# Patient Record
Sex: Male | Born: 1955 | Race: White | Hispanic: No | Smoking: Never smoker
Health system: Southern US, Community
[De-identification: ages and names within clinical notes are randomized; demographics above are authoritative.]

## PROBLEM LIST (undated history)

## (undated) DIAGNOSIS — C801 Malignant (primary) neoplasm, unspecified: Secondary | ICD-10-CM

## (undated) DIAGNOSIS — B029 Zoster without complications: Secondary | ICD-10-CM

## (undated) HISTORY — PX: OTHER SURGICAL HISTORY: SHX169

---

## 2012-11-21 HISTORY — PX: HERNIA REPAIR: SHX51

## 2012-11-21 HISTORY — PX: OTHER SURGICAL HISTORY: SHX169

## 2015-11-22 DIAGNOSIS — C801 Malignant (primary) neoplasm, unspecified: Secondary | ICD-10-CM

## 2015-11-22 HISTORY — DX: Malignant (primary) neoplasm, unspecified: C80.1

## 2016-03-21 HISTORY — PX: BIOPSY PROSTATE: PRO28

## 2016-08-01 ENCOUNTER — Other Ambulatory Visit: Payer: Self-pay | Admitting: Urology

## 2016-08-29 ENCOUNTER — Other Ambulatory Visit (HOSPITAL_COMMUNITY): Payer: Self-pay | Admitting: *Deleted

## 2016-08-29 NOTE — Patient Instructions (Addendum)
SPURGEON EHLY  08/29/2016   Your procedure is scheduled on: 09-01-16  Report to [facility]  Entrance take [facility]  elevators to 3rd floor to  [facility] at 930 AM.  Call this number if you have problems the morning of surgery [phone]   Remember: ONLY 1 PERSON MAY GO WITH YOU TO SHORT STAY TO GET  READY MORNING OF [place].  Do not eat food  :After [place] PREP INSTRUCTIONS FROM DR Alinda Money. CLEAR LIQUIDS ALL DAY Wednesday 08-31-16 PER DR BORDEN INSTRUCTIONS.NO CLEAR LIQUIDS AFTER MIDNIGHT WEDNESDAYNIGHT.      Take these medicines the morning of surgery with A SIP OF WATER: none              You may not have any metal on your body including hair pins and              piercings  Do not wear jewelry, make-up, lotions, powders or perfumes, deodorant             Do not wear nail polish.  Do not shave  48 hours prior to surgery.              Men may shave face and neck.   Do not bring valuables to the hospital. [place].  Contacts, dentures or bridgework may not be worn into surgery.  Leave suitcase in the car. After surgery it may be brought to your room.                  Please read over the following fact sheets you were given: _____________________________________________________________________                CLEAR LIQUID DIET   Foods Allowed                                                                     Foods Excluded  Coffee and tea, regular and decaf                             liquids that you cannot  Plain Jell-O in any flavor                                             see through such as: Fruit ices (not with fruit pulp)                                     milk, soups, orange juice  Iced Popsicles                                    All solid food Carbonated beverages, regular and diet  Cranberry, grape and  apple juices Sports drinks like Gatorade Lightly seasoned clear broth or consume(fat free) Sugar, honey syrup  Sample Menu Breakfast                                Lunch                                     Supper Cranberry juice                    Beef broth                            Chicken broth Jell-O                                     Grape juice                           Apple juice Coffee or tea                        Jell-O                                      Popsicle                                                Coffee or tea                        Coffee or tea  _____________________________________________________________________  [facility] - Preparing for Surgery Before surgery, you can play an important role.  Because skin is not sterile, your skin needs to be as free of germs as possible.  You can reduce the number of germs on your skin by washing with CHG (chlorahexidine gluconate) soap before surgery.  CHG is an antiseptic cleaner which kills germs and bonds with the skin to continue killing germs even after washing. Please DO NOT use if you have an allergy to CHG or antibacterial soaps.  If your skin becomes reddened/irritated stop using the CHG and inform your nurse when you arrive at Short Stay. Do not shave (including legs and underarms) for at least 48 hours prior to the first CHG shower.  You may shave your face/neck. Please follow these instructions carefully:  1.  Shower with CHG Soap the night before surgery and the  morning of Surgery.  2.  If you choose to wash your hair, wash your hair first as usual with your  normal  shampoo.  3.  After you shampoo, rinse your hair and body thoroughly to remove the  shampoo.                           4.  Use CHG as you would any other liquid soap.  You can apply chg directly  to the skin and wash  Gently with a scrungie or clean washcloth.  5.  Apply the CHG Soap to your body ONLY FROM THE NECK DOWN.   Do  not use on face/ open                           Wound or open sores. Avoid contact with eyes, ears mouth and genitals (private parts).                       Wash face,  Genitals (private parts) with your normal soap.             6.  Wash thoroughly, paying special attention to the area where your surgery  will be performed.  7.  Thoroughly rinse your body with warm water from the neck down.  8.  DO NOT shower/wash with your normal soap after using and rinsing off  the CHG Soap.                9.  Pat yourself dry with a clean towel.            10.  Wear clean pajamas.            11.  Place clean sheets on your bed the night of your first shower and do not  sleep with pets. Day of Surgery : Do not apply any lotions/deodorants the morning of surgery.  Please wear clean clothes to the hospital/surgery center.  FAILURE TO FOLLOW THESE INSTRUCTIONS MAY RESULT IN THE CANCELLATION OF YOUR SURGERY PATIENT SIGNATURE_________________________________  NURSE SIGNATURE__________________________________  ________________________________________________________________________   Adam Phenix  An incentive spirometer is a tool that can help keep your lungs clear and active. This tool measures how well you are filling your lungs with each breath. Taking long deep breaths may help reverse or decrease the chance of developing breathing (pulmonary) problems (especially infection) following:  A long period of time when you are unable to move or be active. BEFORE THE PROCEDURE   If the spirometer includes an indicator to show your best effort, your nurse or respiratory therapist will set it to a desired goal.  If possible, sit up straight or lean slightly forward. Try not to slouch.  Hold the incentive spirometer in an upright position. INSTRUCTIONS FOR USE  1. Sit on the edge of your bed if possible, or sit up as far as you can in bed or on a chair. 2. Hold the incentive spirometer in an upright  position. 3. Breathe out normally. 4. Place the mouthpiece in your mouth and seal your lips tightly around it. 5. Breathe in slowly and as deeply as possible, raising the piston or the ball toward the top of the column. 6. Hold your breath for 3-5 seconds or for as long as possible. Allow the piston or ball to fall to the bottom of the column. 7. Remove the mouthpiece from your mouth and breathe out normally. 8. Rest for a few seconds and repeat Steps 1 through 7 at least 10 times every 1-2 hours when you are awake. Take your time and take a few normal breaths between deep breaths. 9. The spirometer may include an indicator to show your best effort. Use the indicator as a goal to work toward during each repetition. 10. After each set of 10 deep breaths, practice coughing to be sure your lungs are clear. If you have an incision (the cut made at the time of  surgery), support your incision when coughing by placing a pillow or rolled up towels firmly against it. Once you are able to get out of bed, walk around indoors and cough well. You may stop using the incentive spirometer when instructed by your caregiver.  RISKS AND COMPLICATIONS  Take your time so you do not get dizzy or light-headed.  If you are in pain, you may need to take or ask for pain medication before doing incentive spirometry. It is harder to take a deep breath if you are having pain. AFTER USE  Rest and breathe slowly and easily.  It can be helpful to keep track of a log of your progress. Your caregiver can provide you with a simple table to help with this. If you are using the spirometer at home, follow these instructions: [place] IF:   You are having difficultly using the spirometer.  You have trouble using the spirometer as often as instructed.  Your pain medication is not giving enough relief while using the spirometer.  You develop fever of 100.5 F (38.1 C) or higher. SEEK IMMEDIATE MEDICAL CARE IF:    You cough up bloody sputum that had not been present before.  You develop fever of 102 F (38.9 C) or greater.  You develop worsening pain at or near the incision site. MAKE SURE YOU:   Understand these instructions.  Will watch your condition.  Will get help right away if you are not doing well or get worse. Document Released: 03/20/2007 Document Revised: 01/30/2012 Document Reviewed: 05/21/2007 Exit[employer] Patient Information 2014 Exit[employer], [place].   ________________________________________________________________________  WHAT IS A BLOOD TRANSFUSION? Blood Transfusion Information  A transfusion is the replacement of blood or some of its parts. Blood is made up of multiple cells which provide different functions.  Red blood cells carry oxygen and are used for blood loss replacement.  White blood cells fight against infection.  Platelets control bleeding.  Plasma helps clot blood.  Other blood products are available for specialized needs, such as hemophilia or other clotting disorders. BEFORE THE TRANSFUSION  Who gives blood for transfusions?   Healthy volunteers who are fully evaluated to make sure their blood is safe. This is blood bank blood. Transfusion therapy is the safest it has ever been in the practice of medicine. Before blood is taken from a donor, a complete history is taken to make sure that person has no history of diseases nor engages in risky social behavior (examples are intravenous drug use or sexual activity with multiple partners). The donor's travel history is screened to minimize risk of transmitting infections, such as malaria. The donated blood is tested for signs of infectious diseases, such as HIV and hepatitis. The blood is then tested to be sure it is compatible with you in order to minimize the chance of a transfusion reaction. If you or a relative donates blood, this is often done in anticipation of surgery and is not appropriate for emergency  situations. It takes many days to process the donated blood. RISKS AND COMPLICATIONS Although transfusion therapy is very safe and saves many lives, the main dangers of transfusion include:   Getting an infectious disease.  Developing a transfusion reaction. This is an allergic reaction to something in the blood you were given. Every precaution is taken to prevent this. The decision to have a blood transfusion has been considered carefully by your caregiver before blood is given. Blood is not given unless the benefits outweigh the risks. AFTER THE  TRANSFUSION  Right after receiving a blood transfusion, you will usually feel much better and more energetic. This is especially true if your red blood cells have gotten low (anemic). The transfusion raises the level of the red blood cells which carry oxygen, and this usually causes an energy increase.  The nurse administering the transfusion will monitor you carefully for complications. HOME CARE INSTRUCTIONS  No special instructions are needed after a transfusion. You may find your energy is better. Speak with your caregiver about any limitations on activity for underlying diseases you may have. SEEK MEDICAL CARE IF:   Your condition is not improving after your transfusion.  You develop redness or irritation at the intravenous (IV) site. SEEK IMMEDIATE MEDICAL CARE IF:  Any of the following symptoms occur over the next 12 hours:  Shaking chills.  You have a temperature by mouth above 102 F (38.9 C), not controlled by medicine.  Chest, back, or muscle pain.  People around you feel you are not acting correctly or are confused.  Shortness of breath or difficulty breathing.  Dizziness and fainting.  You get a rash or develop hives.  You have a decrease in urine output.  Your urine turns a dark color or changes to pink, red, or brown. Any of the following symptoms occur over the next 10 days:  You have a temperature by mouth above  102 F (38.9 C), not controlled by medicine.  Shortness of breath.  Weakness after normal activity.  The white part of the eye turns yellow (jaundice).  You have a decrease in the amount of urine or are urinating less often.  Your urine turns a dark color or changes to pink, red, or brown. Document Released: 11/04/2000 Document Revised: 01/30/2012 Document Reviewed: 06/23/2008 [facility] Patient Information 2014 [place], [place].  _______________________________________________________________________

## 2016-08-30 ENCOUNTER — Ambulatory Visit (HOSPITAL_COMMUNITY)
Admission: RE | Admit: 2016-08-30 | Discharge: 2016-08-30 | Disposition: A | Discharge status: Home / Self Care | Payer: Self-pay | Source: Ambulatory Visit | Attending: Urology | Admitting: Urology

## 2016-08-30 ENCOUNTER — Encounter (HOSPITAL_COMMUNITY)
Admission: RE | Admit: 2016-08-30 | Discharge: 2016-08-30 | Disposition: A | Discharge status: Home / Self Care | Payer: Self-pay | Source: Ambulatory Visit | Attending: Urology | Admitting: Urology

## 2016-08-30 ENCOUNTER — Encounter (HOSPITAL_COMMUNITY): Payer: Self-pay

## 2016-08-30 DIAGNOSIS — Z01818 Encounter for other preprocedural examination: Secondary | ICD-10-CM

## 2016-08-30 HISTORY — DX: Malignant (primary) neoplasm, unspecified: C80.1

## 2016-08-30 HISTORY — DX: Zoster without complications: B02.9

## 2016-08-30 LAB — BASIC METABOLIC PANEL
Anion gap: 9 (ref 5–15)
BUN: 16 mg/dL (ref 6–20)
CHLORIDE: 103 mmol/L (ref 101–111)
CO2: 27 mmol/L (ref 22–32)
CREATININE: 0.91 mg/dL (ref 0.61–1.24)
Calcium: 9.3 mg/dL (ref 8.9–10.3)
GFR calc Af Amer: >60 mL/min (ref >60)
GFR calc non Af Amer: >60 mL/min (ref >60)
Glucose, Bld: 92 mg/dL (ref 65–99)
POTASSIUM: 3.9 mmol/L (ref 3.5–5.1)
SODIUM: 139 mmol/L (ref 135–145)

## 2016-08-30 LAB — CBC
HEMATOCRIT: 43.4 % (ref 39.0–52.0)
Hemoglobin: 14.3 g/dL (ref 13.0–17.0)
MCH: 30.8 pg (ref 26.0–34.0)
MCHC: 32.9 g/dL (ref 30.0–36.0)
MCV: 93.3 fL (ref 78.0–100.0)
PLATELETS: 198 10*3/uL (ref 150–400)
RBC: 4.65 MIL/uL (ref 4.22–5.81)
RDW: 13 % (ref 11.5–15.5)
WBC: 6.1 10*3/uL (ref 4.0–10.5)

## 2016-08-30 LAB — ABO/RH: ABO/RH(D): A NEG

## 2016-08-31 NOTE — H&P (Signed)
Office Visit Report     08/16/2016   --------------------------------------------------------------------------------   Rickey Davidson  [id number]  PRIMARY CARE:    DOB: 1956-06-14, 60 year old Male  REFERRING:  Brad L. Exie Parody, MD  SSN: -[id number]  PROVIDER:  Raynelle Bring, M.D.    LOCATION:  [facility] [phone]   --------------------------------------------------------------------------------   CC/HPI: CC: Prostate Cancer   PCP: Dr. Stoney Bang   Rickey Davidson is a 60 year old gentleman that I initially saw in consultation in June 2017 for evaluation of prostate cancer. He was found to have an elevated PSA of 10.1 prompting a TRUS biopsy of the prostate by Dr. Clyde Lundborg in [place], [place] on 02/25/16. He was found to have 6 out of 16 biopsy cores positive for Gleason 3+3=6 adenocarcinoma of the prostate. He was most interested in proceeding with surgery but was uninsured at the time of his last consultation and was attempting to qualify for Medicaid. He ultimately was found to be not eligible for Medicaid. Initially, he delayed any treatment and ultimately has decided to proceed with surgery without insurance. He denies any newmedical problems over the past few months.   Family history: Father.   Imaging studies: None.   PMH: He has a history of GERD and asthma.  PSH: Left inguinal hernia, laparoscopic Nissen   TNM stage: cT1c Nx Mx  PSA: 10.1  Gleason score: 3+3=6  Biopsy (02/25/16 - read by Dr. Mark Martinique, [facility], [place] # [phone]): 6/16 cores positive  Left: L apex (2/2 cores, 10% and 10%, 3+3=6)  Right: R apex (2/2 cores, 60% and 100%, 3+3=6), R mid (2/3 cores, 40%,20%, 3+3=6)  Prostate volume: 41.1 cc  PSAD: 0.25   Urinary function: IPSS is 9.  Erectile function: SHIM score is 1 (not sexually active).     ALLERGIES: No Known Drug Allergies    MEDICATIONS: Acyclovir 200 mg capsule     GU PSH: Hernia Repair, Left - about 2014    NON-GU PSH:  Laparoscopy, Surgical, Repair Of Paraesophageal Hernia, Includes Fundoplasty, When Performed; Withou    GU PMH: Prostate Cancer - 04/26/2016    NON-GU PMH: Asthma GERD    FAMILY HISTORY: Breast Cancer - Mother Death of family member - Father, Mother Prostate Cancer - Father   SOCIAL HISTORY: Marital Status: Single Current Smoking Status: Patient has never smoked.  Drinks 1 drink per day. Types of alcohol consumed: Beer.  Drinks 3 caffeinated drinks per day.    REVIEW OF SYSTEMS:    GU Review Male:   Patient reports get up at night to urinate. Patient denies frequent urination, burning/ pain with urination, leakage of urine, hard to postpone urination, stream starts and stops, trouble starting your streams, and have to strain to urinate .  Gastrointestinal (Lower):   Patient denies diarrhea and constipation.  Gastrointestinal (Upper):   Patient denies nausea and vomiting.  Constitutional:   Patient denies fever, night sweats, weight loss, and fatigue.  Skin:   Patient denies skin rash/ lesion and itching.  Eyes:   Patient denies blurred vision and double vision.  Ears/ Nose/ Throat:   Patient denies sore throat and sinus problems.  Hematologic/Lymphatic:   Patient denies swollen glands and easy bruising.  Cardiovascular:   Patient denies leg swelling and chest pains.  Respiratory:   Patient denies cough and shortness of breath.  Endocrine:   Patient denies excessive thirst.  Musculoskeletal:   Patient reports joint pain. Patient denies back pain.  Neurological:   Patient denies headaches and dizziness.  Psychologic:   Patient denies depression and anxiety.   VITAL SIGNS:      08/16/2016 08:14 AM  Weight 176 lb / 79.83 kg  Height 64 in / 162.56 cm  BP 132/74 mmHg  Pulse 71 /min  BMI 30.2 kg/m   GU PHYSICAL EXAMINATION:    Prostate: 35 cc. No nodularity or induration.    MULTI-SYSTEM PHYSICAL EXAMINATION:    Constitutional: Well-nourished. No physical deformities. Normally  developed. Good grooming.  Respiratory: No labored breathing, no use of accessory muscles.   Cardiovascular: Normal temperature, normal extremity pulses, no swelling, no varicosities.  Gastrointestinal: No mass, no tenderness, no rigidity, non obese abdomen.     PAST DATA REVIEWED:  Source Of History:  Patient  Lab Test Review:   PSA  Records Review:   Pathology Reports, Previous Patient Records  Urine Test Review:   Urinalysis   PROCEDURES:          Urinalysis - 81003 Dipstick Dipstick Cont'd  Specimen: Voided Bilirubin: Neg  Color: Yellow Ketones: Neg  Appearance: Clear Blood: Neg  Specific Gravity: 1.025 Protein: Neg  pH: 5.0 Urobilinogen: 0.2  Glucose: Neg Nitrites: Neg    Leukocyte Esterase: Neg    ASSESSMENT:      ICD-10 Details  1 GU:   Prostate Cancer - C61    PLAN:           Orders Labs PSA          Schedule Return Visit: Keep Scheduled Appointment          Document Letter(s):  Created for Patient: Clinical Summary         Notes:   1. Prostate cancer: Since his last visit, I have obtained his pathology report with findings as dictated above. This confirms Gleason 6, moderate volume disease. We reviewed this information and the fact that it confirms our prior discussion regarding his prostate cancer. He does still adamantly wanted to proceed with surgical therapy and is currently scheduled for a radical prostatectomy on October 12.   I've recommended that we repeat his PSA to fully assess his risk. He understands how this might affect her decision to perform a lymphadenectomy.   The patient was counseled about the natural history of prostate cancer and the standard treatment options that are available for prostate cancer. It was explained to him how his age and life expectancy, clinical stage, Gleason score, and PSA affect his prognosis, the decision to proceed with additional staging studies, as well as how that information influences recommended treatment  strategies. We discussed the roles for active surveillance, radiation therapy, surgical therapy, androgen deprivation, as well as ablative therapy options for the treatment of prostate cancer as appropriate to his individual cancer situation. We discussed the risks and benefits of these options with regard to their impact on cancer control and also in terms of potential adverse events, complications, and impact on quality of life particularly related to urinary and sexual function. The patient was encouraged to ask questions throughout the discussion today and all questions were answered to his stated satisfaction. In addition, the patient was provided with and/or directed to appropriate resources and literature for further education about prostate cancer and treatment options.   We discussed surgical therapy for prostate cancer including the different available surgical approaches. We discussed, in detail, the risks and expectations of surgery with regard to cancer control, urinary control, and erectile function as well as the expected  postoperative recovery process. Additional risks of surgery including but not limited to bleeding, infection, hernia formation, nerve damage, lymphocele formation, bowel/rectal injury potentially necessitating colostomy, damage to the urinary tract resulting in urine leakage, urethral stricture, and the cardiopulmonary risks such as myocardial infarction, stroke, death, venothromboembolism, etc. were explained. The risk of open surgical conversion for robotic/laparoscopic prostatectomy was also discussed.   We will proceed with a bilateral nerve sparing robot-assisted laparoscopic radical prostatectomy plus or minus pelvic lymphadenectomy pending his PSA result. All questions were answered to his stated satisfaction.   CC: Dr. Clyde Lundborg  Dr. Stoney Bang          E & M CODE: I spent at least 40 minutes face to face with the patient, more than 50% of that time was  spent on counseling and/or coordinating care.     * Signed by Raynelle Bring, M.D. on 08/16/16 at 4:27 PM (EDT)*       APPENDED NOTES:  Repeat PSA was 6.82. Will proceed with a bilateral nerve sparing robot-assisted laparoscopic radical prostatectomy but will not perform a lymph node dissection considering his low risk disease.     * Signed by Raynelle Bring, M.D. on 08/17/16 at 9:40 AM (EDT)*

## 2016-09-01 ENCOUNTER — Inpatient Hospital Stay (HOSPITAL_COMMUNITY)
Admission: RE | Admit: 2016-09-01 | Discharge: 2016-09-02 | DRG: 708 | Disposition: A | Discharge status: Home / Self Care | Payer: Self-pay | Source: Ambulatory Visit | Attending: Urology | Admitting: Urology

## 2016-09-01 ENCOUNTER — Inpatient Hospital Stay (HOSPITAL_COMMUNITY): Payer: Self-pay | Admitting: Anesthesiology

## 2016-09-01 ENCOUNTER — Encounter (HOSPITAL_COMMUNITY)
Admission: RE | Disposition: A | Discharge status: Home / Self Care | Payer: Self-pay | Source: Ambulatory Visit | Attending: Urology

## 2016-09-01 ENCOUNTER — Encounter (HOSPITAL_COMMUNITY): Payer: Self-pay | Admitting: *Deleted

## 2016-09-01 DIAGNOSIS — Z803 Family history of malignant neoplasm of breast: Secondary | ICD-10-CM

## 2016-09-01 DIAGNOSIS — Z8042 Family history of malignant neoplasm of prostate: Secondary | ICD-10-CM

## 2016-09-01 DIAGNOSIS — B009 Herpesviral infection, unspecified: Secondary | ICD-10-CM | POA: Diagnosis not present

## 2016-09-01 DIAGNOSIS — K219 Gastro-esophageal reflux disease without esophagitis: Secondary | ICD-10-CM | POA: Diagnosis present

## 2016-09-01 DIAGNOSIS — J45909 Unspecified asthma, uncomplicated: Secondary | ICD-10-CM | POA: Diagnosis present

## 2016-09-01 DIAGNOSIS — C61 Malignant neoplasm of prostate: Principal | ICD-10-CM | POA: Diagnosis present

## 2016-09-01 HISTORY — PX: ROBOT ASSISTED LAPAROSCOPIC RADICAL PROSTATECTOMY: SHX5141

## 2016-09-01 LAB — TYPE AND SCREEN
ABO/RH(D): A NEG
Antibody Screen: NEGATIVE

## 2016-09-01 LAB — HEMOGLOBIN AND HEMATOCRIT, BLOOD
HEMATOCRIT: 39.7 % (ref 39.0–52.0)
HEMOGLOBIN: 13.6 g/dL (ref 13.0–17.0)

## 2016-09-01 SURGERY — XI ROBOTIC ASSISTED LAPAROSCOPIC RADICAL PROSTATECTOMY LEVEL 2
Anesthesia: General

## 2016-09-01 MED ORDER — HEPARIN SODIUM (PORCINE) 1000 UNIT/ML IJ SOLN
INTRAMUSCULAR | Status: DC | PRN
  Administered 2016-09-01: 11:00:00

## 2016-09-01 MED ORDER — KETOROLAC TROMETHAMINE 15 MG/ML IJ SOLN
15.0000 mg | Freq: Four times a day (QID) | INTRAMUSCULAR | Status: DC
  Administered 2016-09-01 – 2016-09-02 (×4): 15 mg via INTRAVENOUS
  Filled 2016-09-01 (×4): qty 1

## 2016-09-01 MED ORDER — DOCUSATE SODIUM 100 MG PO CAPS
100.0000 mg | ORAL_CAPSULE | Freq: Two times a day (BID) | ORAL | Status: DC
  Administered 2016-09-01 – 2016-09-02 (×2): 100 mg via ORAL
  Filled 2016-09-01 (×2): qty 1

## 2016-09-01 MED ORDER — ONDANSETRON HCL 4 MG/2ML IJ SOLN
INTRAMUSCULAR | Status: DC | PRN
Start: 2016-09-01 — End: 2016-09-01
  Administered 2016-09-01: 4 mg via INTRAVENOUS

## 2016-09-01 MED ORDER — MIDAZOLAM HCL 2 MG/2ML IJ SOLN
INTRAMUSCULAR | Status: AC
  Filled 2016-09-01: qty 2

## 2016-09-01 MED ORDER — ONDANSETRON HCL 4 MG/2ML IJ SOLN
INTRAMUSCULAR | Status: AC
Start: 2016-09-01 — End: 2016-09-01
  Filled 2016-09-01: qty 2

## 2016-09-01 MED ORDER — ACETAMINOPHEN 325 MG PO TABS: 650.0000 mg | ORAL_TABLET | ORAL | Status: DC | PRN

## 2016-09-01 MED ORDER — CEFAZOLIN IN D5W 1 GM/50ML IV SOLN
1.0000 g | Freq: Three times a day (TID) | INTRAVENOUS | Status: AC
  Administered 2016-09-01 – 2016-09-02 (×2): 1 g via INTRAVENOUS
  Filled 2016-09-01 (×2): qty 50

## 2016-09-01 MED ORDER — PROMETHAZINE HCL 25 MG/ML IJ SOLN: 6.2500 mg | INTRAMUSCULAR | Status: DC | PRN

## 2016-09-01 MED ORDER — HYDROMORPHONE HCL 1 MG/ML IJ SOLN
INTRAMUSCULAR | Status: AC
  Filled 2016-09-01: qty 1

## 2016-09-01 MED ORDER — SUGAMMADEX SODIUM 200 MG/2ML IV SOLN
INTRAVENOUS | Status: DC | PRN
  Administered 2016-09-01: 200 mg via INTRAVENOUS

## 2016-09-01 MED ORDER — BUPIVACAINE HCL (PF) 0.25 % IJ SOLN
INTRAMUSCULAR | Status: AC
  Filled 2016-09-01: qty 30

## 2016-09-01 MED ORDER — HYDROMORPHONE HCL 1 MG/ML IJ SOLN
0.2500 mg | INTRAMUSCULAR | Status: DC | PRN
  Administered 2016-09-01 (×4): 0.5 mg via INTRAVENOUS

## 2016-09-01 MED ORDER — ONDANSETRON HCL 4 MG/2ML IJ SOLN
4.0000 mg | Freq: Four times a day (QID) | INTRAMUSCULAR | Status: DC | PRN
  Filled 2016-09-01: qty 2

## 2016-09-01 MED ORDER — SULFAMETHOXAZOLE-TRIMETHOPRIM 800-160 MG PO TABS: 1.0000 | ORAL_TABLET | Freq: Two times a day (BID) | ORAL | 0 refills | Status: AC

## 2016-09-01 MED ORDER — DEXAMETHASONE SODIUM PHOSPHATE 10 MG/ML IJ SOLN
INTRAMUSCULAR | Status: DC | PRN
  Administered 2016-09-01: 10 mg via INTRAVENOUS

## 2016-09-01 MED ORDER — SODIUM CHLORIDE 0.9 % IR SOLN
Status: DC | PRN
  Administered 2016-09-01: 1 via INTRAVESICAL

## 2016-09-01 MED ORDER — ROCURONIUM BROMIDE 10 MG/ML (PF) SYRINGE
PREFILLED_SYRINGE | INTRAVENOUS | Status: DC | PRN
  Administered 2016-09-01: 50 mg via INTRAVENOUS
  Administered 2016-09-01: 20 mg via INTRAVENOUS

## 2016-09-01 MED ORDER — SODIUM CHLORIDE 0.9 % IV BOLUS (SEPSIS): 1000.0000 mL | Freq: Once | INTRAVENOUS | Status: DC

## 2016-09-01 MED ORDER — LIDOCAINE 2% (20 MG/ML) 5 ML SYRINGE
INTRAMUSCULAR | Status: AC
Start: 2016-09-01 — End: 2016-09-01
  Filled 2016-09-01: qty 5

## 2016-09-01 MED ORDER — DIPHENHYDRAMINE HCL 12.5 MG/5ML PO ELIX: 12.5000 mg | ORAL_SOLUTION | Freq: Four times a day (QID) | ORAL | Status: DC | PRN

## 2016-09-01 MED ORDER — MORPHINE SULFATE (PF) 2 MG/ML IV SOLN
2.0000 mg | INTRAVENOUS | Status: DC | PRN
Start: 2016-09-01 — End: 2016-09-02

## 2016-09-01 MED ORDER — LACTATED RINGERS IV SOLN
INTRAVENOUS | Status: DC
  Administered 2016-09-01: 1000 mL via INTRAVENOUS
  Administered 2016-09-01: 13:00:00 via INTRAVENOUS

## 2016-09-01 MED ORDER — SUCCINYLCHOLINE CHLORIDE 20 MG/ML IJ SOLN
INTRAMUSCULAR | Status: AC
  Filled 2016-09-01: qty 1

## 2016-09-01 MED ORDER — FENTANYL CITRATE (PF) 100 MCG/2ML IJ SOLN
INTRAMUSCULAR | Status: DC | PRN
  Administered 2016-09-01: 100 ug via INTRAVENOUS
  Administered 2016-09-01 (×3): 50 ug via INTRAVENOUS

## 2016-09-01 MED ORDER — ROCURONIUM BROMIDE 50 MG/5ML IV SOSY
PREFILLED_SYRINGE | INTRAVENOUS | Status: AC
  Filled 2016-09-01: qty 5

## 2016-09-01 MED ORDER — DIPHENHYDRAMINE HCL 50 MG/ML IJ SOLN: 12.5000 mg | Freq: Four times a day (QID) | INTRAMUSCULAR | Status: DC | PRN

## 2016-09-01 MED ORDER — BUPIVACAINE-EPINEPHRINE 0.25% -1:200000 IJ SOLN
INTRAMUSCULAR | Status: DC | PRN
  Administered 2016-09-01: 20 mL

## 2016-09-01 MED ORDER — PROPOFOL 10 MG/ML IV BOLUS
INTRAVENOUS | Status: AC
  Filled 2016-09-01: qty 20

## 2016-09-01 MED ORDER — MIDAZOLAM HCL 5 MG/5ML IJ SOLN
INTRAMUSCULAR | Status: DC | PRN
  Administered 2016-09-01: 2 mg via INTRAVENOUS

## 2016-09-01 MED ORDER — DEXAMETHASONE SODIUM PHOSPHATE 10 MG/ML IJ SOLN
INTRAMUSCULAR | Status: AC
  Filled 2016-09-01: qty 1

## 2016-09-01 MED ORDER — HYDROCODONE-ACETAMINOPHEN 5-325 MG PO TABS: 1.0000 | ORAL_TABLET | Freq: Four times a day (QID) | ORAL | 0 refills | Status: AC | PRN

## 2016-09-01 MED ORDER — STERILE WATER FOR IRRIGATION IR SOLN
Status: DC | PRN
  Administered 2016-09-01: 1000 mL

## 2016-09-01 MED ORDER — SUCCINYLCHOLINE CHLORIDE 200 MG/10ML IV SOSY
PREFILLED_SYRINGE | INTRAVENOUS | Status: DC | PRN
  Administered 2016-09-01: 100 mg via INTRAVENOUS

## 2016-09-01 MED ORDER — LIDOCAINE 2% (20 MG/ML) 5 ML SYRINGE
INTRAMUSCULAR | Status: DC | PRN
  Administered 2016-09-01: 100 mg via INTRAVENOUS

## 2016-09-01 MED ORDER — HEPARIN SODIUM (PORCINE) 1000 UNIT/ML IJ SOLN
INTRAMUSCULAR | Status: AC
  Filled 2016-09-01: qty 1

## 2016-09-01 MED ORDER — PROPOFOL 10 MG/ML IV BOLUS
INTRAVENOUS | Status: DC | PRN
  Administered 2016-09-01: 200 mg via INTRAVENOUS

## 2016-09-01 MED ORDER — CEFAZOLIN SODIUM-DEXTROSE 2-4 GM/100ML-% IV SOLN
INTRAVENOUS | Status: AC
  Filled 2016-09-01: qty 100

## 2016-09-01 MED ORDER — CEFAZOLIN SODIUM-DEXTROSE 2-4 GM/100ML-% IV SOLN
2.0000 g | INTRAVENOUS | Status: AC
  Administered 2016-09-01: 2 g via INTRAVENOUS

## 2016-09-01 MED ORDER — FENTANYL CITRATE (PF) 250 MCG/5ML IJ SOLN
INTRAMUSCULAR | Status: AC
  Filled 2016-09-01: qty 5

## 2016-09-01 MED ORDER — KCL IN DEXTROSE-NACL 20-5-0.45 MEQ/L-%-% IV SOLN
INTRAVENOUS | Status: DC
  Administered 2016-09-01 – 2016-09-02 (×3): via INTRAVENOUS
  Filled 2016-09-01 (×3): qty 1000

## 2016-09-01 SURGICAL SUPPLY — 54 items
APPLICATOR COTTON TIP 6IN STRL (MISCELLANEOUS) ×4 IMPLANT
CATH FOLEY 2WAY SLVR 18FR 30CC (CATHETERS) ×4 IMPLANT
CATH ROBINSON RED A/P 16FR (CATHETERS) ×4 IMPLANT
CATH ROBINSON RED A/P 8FR (CATHETERS) ×4 IMPLANT
CATH TIEMANN FOLEY 18FR 5CC (CATHETERS) ×4 IMPLANT
CHLORAPREP W/TINT 26ML (MISCELLANEOUS) ×4 IMPLANT
CLIP LIGATING HEM O LOK PURPLE (MISCELLANEOUS) ×8 IMPLANT
COVER SURGICAL LIGHT HANDLE (MISCELLANEOUS) ×4 IMPLANT
COVER TIP SHEARS 8 DVNC (MISCELLANEOUS) ×2 IMPLANT
COVER TIP SHEARS 8MM DA VINCI (MISCELLANEOUS) ×2
CUTTER ECHEON FLEX ENDO 45 340 (ENDOMECHANICALS) ×4 IMPLANT
DECANTER SPIKE VIAL GLASS SM (MISCELLANEOUS) ×4 IMPLANT
DERMABOND ADVANCED (GAUZE/BANDAGES/DRESSINGS)
DERMABOND ADVANCED .7 DNX12 (GAUZE/BANDAGES/DRESSINGS) IMPLANT
DRAPE ARM DVNC X/XI (DISPOSABLE) ×8 IMPLANT
DRAPE COLUMN DVNC XI (DISPOSABLE) ×2 IMPLANT
DRAPE DA VINCI XI ARM (DISPOSABLE) ×8
DRAPE DA VINCI XI COLUMN (DISPOSABLE) ×2
DRAPE SURG IRRIG POUCH 19X23 (DRAPES) ×4 IMPLANT
DRSG TEGADERM 4X4.75 (GAUZE/BANDAGES/DRESSINGS) ×4 IMPLANT
ELECT REM PT RETURN 9FT ADLT (ELECTROSURGICAL) ×4
ELECTRODE REM PT RTRN 9FT ADLT (ELECTROSURGICAL) ×2 IMPLANT
GLOVE BIO SURGEON STRL SZ 6.5 (GLOVE) ×3 IMPLANT
GLOVE BIO SURGEONS STRL SZ 6.5 (GLOVE) ×1
GLOVE BIOGEL M STRL SZ7.5 (GLOVE) ×8 IMPLANT
GOWN STRL REUS W/TWL LRG LVL3 (GOWN DISPOSABLE) ×12 IMPLANT
HOLDER FOLEY CATH W/STRAP (MISCELLANEOUS) ×4 IMPLANT
IRRIG SUCT STRYKERFLOW 2 WTIP (MISCELLANEOUS) ×4
IRRIGATION SUCT STRKRFLW 2 WTP (MISCELLANEOUS) ×2 IMPLANT
IV LACTATED RINGERS 1000ML (IV SOLUTION) ×4 IMPLANT
LIQUID BAND (GAUZE/BANDAGES/DRESSINGS) ×4 IMPLANT
NDL SAFETY ECLIPSE 18X1.5 (NEEDLE) ×2 IMPLANT
NEEDLE HYPO 18GX1.5 SHARP (NEEDLE) ×2
PACK ROBOT UROLOGY CUSTOM (CUSTOM PROCEDURE TRAY) ×4 IMPLANT
RELOAD GREEN ECHELON 45 (STAPLE) ×4 IMPLANT
SEAL CANN UNIV 5-8 DVNC XI (MISCELLANEOUS) ×8 IMPLANT
SEAL XI 5MM-8MM UNIVERSAL (MISCELLANEOUS) ×8
SOLUTION ELECTROLUBE (MISCELLANEOUS) ×4 IMPLANT
SUT ETHILON 3 0 PS 1 (SUTURE) ×4 IMPLANT
SUT MNCRL 3 0 RB1 (SUTURE) ×2 IMPLANT
SUT MNCRL 3 0 VIOLET RB1 (SUTURE) ×2 IMPLANT
SUT MNCRL AB 4-0 PS2 18 (SUTURE) ×8 IMPLANT
SUT MONOCRYL 3 0 RB1 (SUTURE) ×4
SUT VIC AB 0 CT1 27 (SUTURE) ×2
SUT VIC AB 0 CT1 27XBRD ANTBC (SUTURE) ×2 IMPLANT
SUT VIC AB 0 UR5 27 (SUTURE) ×4 IMPLANT
SUT VIC AB 2-0 SH 27 (SUTURE) ×2
SUT VIC AB 2-0 SH 27X BRD (SUTURE) ×2 IMPLANT
SUT VICRYL 0 UR6 27IN ABS (SUTURE) ×8 IMPLANT
SYR 27GX1/2 1ML LL SAFETY (SYRINGE) ×4 IMPLANT
TOWEL OR 17X26 10 PK STRL BLUE (TOWEL DISPOSABLE) ×4 IMPLANT
TOWEL OR NON WOVEN STRL DISP B (DISPOSABLE) ×4 IMPLANT
TUBING INSUFFLATION 10FT LAP (TUBING) IMPLANT
WATER STERILE IRR 1500ML POUR (IV SOLUTION) ×8 IMPLANT

## 2016-09-01 NOTE — Op Note (Signed)
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 Robotic assisted laparoscopic radical prostatectomy (bilateral nerve sparing)  Surgeon: Roxy Horseman, Brooke Bonito. M.D.  Assistant: Debbrah Alar, PA-C  An assistant was required for this surgical procedure.  The duties of the assistant included but were not limited to suctioning, passing suture, camera manipulation, retraction. This procedure would not be able to be performed without an Environmental consultant.  Anesthesia: General  Complications: None  EBL: 50 mL  IVF:  1300 mL crystalloid  Specimens: 1. Prostate and seminal vesicles  Disposition of specimens: Pathology  Drains: 1. 20 Fr coude catheter 2. # 19 Blake pelvic drain  Indication: Rickey Davidson is a 60 y.o. year old patient with clinically localized prostate cancer.  After a thorough review of the management options for treatment of prostate cancer, he elected to proceed with surgical therapy and the above procedure(s).  We have discussed the potential benefits and risks of the procedure, side effects of the proposed treatment, the likelihood of the patient achieving the goals of the procedure, and any potential problems that might occur during the procedure or recuperation. Informed consent has been obtained.  Description of procedure:  The patient was taken to the operating room and a general anesthetic was administered. He was given preoperative antibiotics, placed in the dorsal lithotomy position, and prepped and draped in the usual sterile fashion. Next a preoperative timeout was performed. A urethral catheter was placed into the bladder and a site was selected near the umbilicus for placement of the camera port. This was placed using a standard open Hassan technique which allowed entry into the peritoneal cavity  under direct vision and without difficulty. An 8 mm port was placed and a pneumoperitoneum established. The camera was then used to inspect the abdomen and there was no evidence of any intra-abdominal injuries or other abnormalities. The remaining abdominal ports were then placed. 8 mm robotic ports were placed in the right lower quadrant, left lower quadrant, and far left lateral abdominal wall. A 5 mm port was placed in the right upper quadrant and a 12 mm port was placed in the right lateral abdominal wall for laparoscopic assistance. All ports were placed under direct vision without difficulty. The surgical cart was then docked.   Utilizing the cautery scissors, the bladder was reflected posteriorly allowing entry into the space of Retzius and identification of the endopelvic fascia and prostate. The periprostatic fat was then removed from the prostate allowing full exposure of the endopelvic fascia. The endopelvic fascia was then incised from the apex back to the base of the prostate bilaterally and the underlying levator muscle fibers were swept laterally off the prostate thereby isolating the dorsal venous complex. The dorsal vein was then stapled and divided with a 45 mm Flex Echelon stapler. Attention then turned to the bladder neck which was divided anteriorly thereby allowing entry into the bladder and exposure of the urethral catheter. The catheter balloon was deflated and the catheter was brought into the operative field and used to retract the prostate anteriorly. The posterior bladder neck was then examined and was divided allowing further dissection between the bladder and prostate posteriorly until the vasa deferentia and seminal vessels were identified. The vasa deferentia were isolated, divided, and lifted anteriorly. The seminal vesicles were dissected down to their tips with care to control the seminal vascular arterial blood  supply. These structures were then lifted anteriorly and the space  between Denonvillier's fascia and the anterior rectum was developed with a combination of sharp and blunt dissection. This isolated the vascular pedicles of the prostate.  The lateral prostatic fascia was then sharply incised allowing release of the neurovascular bundles bilaterally. The vascular pedicles of the prostate were then ligated with Weck clips between the prostate and neurovascular bundles and divided with sharp cold scissor dissection resulting in neurovascular bundle preservation. The neurovascular bundles were then separated off the apex of the prostate and urethra bilaterally.  The urethra was then sharply transected allowing the prostate specimen to be disarticulated. The pelvis was copiously irrigated and hemostasis was ensured. There was no evidence for rectal injury.  Attention then turned to the urethral anastomosis. A 2-0 Vicryl slip knot was placed between Denonvillier's fascia, the posterior bladder neck, and the posterior urethra to reapproximate these structures. A double-armed 3-0 Monocryl suture was then used to perform a 360 running tension-free anastomosis between the bladder neck and urethra. A new urethral catheter was then placed into the bladder and irrigated. There were no blood clots within the bladder and the anastomosis appeared to be watertight. A #19 Blake drain was then brought through the left lateral 8 mm port site and positioned appropriately within the pelvis. It was secured to the skin with a nylon suture. The surgical cart was then undocked. The right lateral 12 mm port site was closed at the fascial level with a 0 Vicryl suture placed laparoscopically. All remaining ports were then removed under direct vision. The prostate specimen was removed intact within the Endopouch retrieval bag via the periumbilical camera port site. This fascial opening was closed with two running 0 Vicryl sutures. 0.25% Marcaine was then injected into all port sites and all incisions  were reapproximated at the skin level with 4-0 Monocryl subcuticular sutures and liquid skin adhesive. The patient appeared to tolerate the procedure well and without complications. The patient was able to be extubated and transferred to the recovery unit in satisfactory condition.  Pryor Curia MD

## 2016-09-01 NOTE — Transfer of Care (Signed)
Immediate Anesthesia Transfer of Care Note  Patient: Rickey Davidson  Procedure(s) Performed: Procedure(s): XI ROBOTIC ASSISTED LAPAROSCOPIC RADICAL PROSTATECTOMY LEVEL 2 (N/A)  Patient Location: PACU  Anesthesia Type:General  Level of Consciousness: sedated  Airway & Oxygen Therapy: Patient Spontanous Breathing and Patient connected to face mask oxygen  Post-op Assessment: Report given to RN and Post -op Vital signs reviewed and stable  Post vital signs: Reviewed and stable  Last Vitals:  Vitals:   09/01/16 0856  BP: 124/74  Pulse: 68  Resp: 18  Temp: 37.1 C    Last Pain:  Vitals:   09/01/16 0856  TempSrc: Oral      Patients Stated Pain Goal: 3 ([id number])  Complications: No apparent anesthesia complications

## 2016-09-01 NOTE — Anesthesia Preprocedure Evaluation (Addendum)
Anesthesia Evaluation  Patient identified by MRN, date of birth, ID band Patient awake    Reviewed: Allergy & Precautions, NPO status , Patient's Chart, lab work & pertinent test results  Airway Mallampati: II  TM Distance: >3 FB Neck ROM: Full    Dental   Some broken, decayed and missing teeth.:   Pulmonary neg pulmonary ROS,    Pulmonary exam normal breath sounds clear to auscultation       Cardiovascular negative cardio ROS Normal cardiovascular exam Rhythm:Regular Rate:Normal  ECG: SB 50, iRBBB   Neuro/Psych negative neurological ROS  negative psych ROS   GI/Hepatic negative GI ROS, Neg liver ROS,   Endo/Other  negative endocrine ROS  Renal/GU negative Renal ROS  negative genitourinary   Musculoskeletal negative musculoskeletal ROS (+)   Abdominal   Peds negative pediatric ROS (+)  Hematology negative hematology ROS (+)   Anesthesia Other Findings   Reproductive/Obstetrics negative OB ROS                           Anesthesia Physical Anesthesia Plan  ASA: II  Anesthesia Plan: General   Post-op Pain Management:    Induction: Intravenous  Airway Management Planned: Oral ETT  Additional Equipment:   Intra-op Plan:   Post-operative Plan: Extubation in OR  Informed Consent: I have reviewed the patients History and Physical, chart, labs and discussed the procedure including the risks, benefits and alternatives for the proposed anesthesia with the patient or authorized representative who has indicated his/her understanding and acceptance.   Dental advisory given  Plan Discussed with: CRNA  Anesthesia Plan Comments:         Anesthesia Quick Evaluation

## 2016-09-01 NOTE — Discharge Instructions (Signed)

## 2016-09-01 NOTE — Anesthesia Procedure Notes (Signed)
Procedure Name: Intubation Date/Time: 09/01/2016 10:55 AM Performed by: Lind Covert Pre-anesthesia Checklist: Patient identified, Emergency Drugs available, Suction available, Patient being monitored and Timeout performed Patient Re-evaluated:Patient Re-evaluated prior to inductionOxygen Delivery Method: Circle system utilized Preoxygenation: Pre-oxygenation with 100% oxygen Intubation Type: IV induction Laryngoscope Size: Mac and 4 Grade View: Grade I Tube type: Oral Tube size: 7.5 mm Number of attempts: 1 Airway Equipment and Method: Stylet Secured at: 22 cm Tube secured with: Tape Dental Injury: Teeth and Oropharynx as per pre-operative assessment

## 2016-09-01 NOTE — Interval H&P Note (Signed)
History and Physical Interval Note:  09/01/2016 10:25 AM  Rickey Davidson  has presented today for surgery, with the diagnosis of PROSTATE CANCER  The various methods of treatment have been discussed with the patient and family. After consideration of risks, benefits and other options for treatment, the patient has consented to  Procedure(s): XI ROBOTIC [place] 2 (N/A) LYMPHADENECTOMY (Bilateral) as a surgical intervention .  The patient's history has been reviewed, patient examined, no change in status, stable for surgery.  I have reviewed the patient's chart and labs.  Questions were answered to the patient's satisfaction.     Rickey Davidson,LES

## 2016-09-01 NOTE — Progress Notes (Signed)
Patient ID: Rickey Davidson, male   DOB: 1956-05-09, 60 y.o.   MRN: [id number]  Post-op note  Subjective: The patient is doing well.  No complaints.  Objective: Vital signs in last 24 hours: Temp:  [97.5 F (36.4 C)-98.8 F (37.1 C)] 98.8 F (37.1 C) (10/12 1440) Pulse Rate:  [68-94] 88 (10/12 1440) Resp:  [14-18] 14 (10/12 1440) BP: (124-157)/(74-95) 132/77 (10/12 1440) SpO2:  [94 %-100 %] 98 % (10/12 1440) Weight:  [74.1 kg (163 lb 6.4 oz)] 74.1 kg (163 lb 6.4 oz) (10/12 0909)  Intake/Output from previous day: No intake/output data recorded. Intake/Output this shift: Total I/O In: 1850 [I.V.:1850] Out: 230 [Urine:150; Drains:30; Blood:50]  Physical Exam:  General: Alert and oriented. Abdomen: Soft, Nondistended. Incisions: Clean and dry. Urine: Clear  Lab Results:  Recent Labs  08/30/16 1105 09/01/16 1358  HGB 14.3 13.6  HCT 43.4 39.7    Assessment/Plan: POD#0   1) Continue to monitor, IS, ambulate   Pryor Curia. MD   LOS: 0 days   Hillary Struss,LES 09/01/2016, 3:07 PM

## 2016-09-01 NOTE — Anesthesia Postprocedure Evaluation (Signed)
Anesthesia Post Note  Patient: Rickey Davidson  Procedure(s) Performed: Procedure(s) (LRB): XI ROBOTIC ASSISTED LAPAROSCOPIC RADICAL PROSTATECTOMY LEVEL 2 (N/A)  Patient location during evaluation: PACU Anesthesia Type: General Level of consciousness: awake and alert and patient cooperative Pain management: pain level controlled Vital Signs Assessment: post-procedure vital signs reviewed and stable Respiratory status: spontaneous breathing and respiratory function stable Cardiovascular status: stable Anesthetic complications: no    Last Vitals:  Vitals:   09/01/16 1330 09/01/16 1345  BP: (!) 145/90 (!) 152/84  Pulse: 94 88  Resp: 18 14  Temp:      Last Pain:  Vitals:   09/01/16 1345  TempSrc:   PainSc: [name]

## 2016-09-02 LAB — HEMOGLOBIN AND HEMATOCRIT, BLOOD
HEMATOCRIT: 36.4 % — AB (ref 39.0–52.0)
Hemoglobin: 12.4 g/dL — ABNORMAL LOW (ref 13.0–17.0)

## 2016-09-02 MED ORDER — BISACODYL 10 MG RE SUPP
10.0000 mg | Freq: Once | RECTAL | Status: AC
  Administered 2016-09-02: 10 mg via RECTAL
  Filled 2016-09-02: qty 1

## 2016-09-02 MED ORDER — HYDROCODONE-ACETAMINOPHEN 5-325 MG PO TABS: 1.0000 | ORAL_TABLET | Freq: Four times a day (QID) | ORAL | Status: DC | PRN

## 2016-09-02 NOTE — Progress Notes (Signed)
Patient ID: Rickey Davidson, male   DOB: 04-05-1956, 60 y.o.   MRN: [id number]  1 Day Post-Op Subjective: The patient is doing well.  No nausea or vomiting. Pain is adequately controlled.  Objective: Vital signs in last 24 hours: Temp:  [97.5 F (36.4 C)-99 F (37.2 C)] 99 F (37.2 C) (10/13 0536) Pulse Rate:  [68-94] 68 (10/13 0536) Resp:  [14-18] 15 (10/13 0536) BP: (115-157)/(65-95) 115/73 (10/13 0536) SpO2:  [94 %-100 %] 97 % (10/13 0536) Weight:  [74.1 kg (163 lb 6.4 oz)] 74.1 kg (163 lb 6.4 oz) (10/12 0909)  Intake/Output from previous day: 10/12 0701 - 10/13 0700 In: 4255 [P.O.:600; I.V.:3605; IV Piggyback:50] Out: 2425 [Urine:2275; Drains:100; Blood:50] Intake/Output this shift: No intake/output data recorded.  Physical Exam:  General: Alert and oriented. CV: RRR Lungs: Clear bilaterally. GI: Soft, Nondistended. Incisions: Clean, dry, and intact Urine: Clear Extremities: Nontender, no erythema, no edema.  Lab Results:  Recent Labs  08/30/16 1105 09/01/16 1358 09/02/16 0521  HGB 14.3 13.6 12.4*  HCT 43.4 39.7 36.4*      Assessment/Plan: POD# 1 s/p robotic prostatectomy.  1) SL IVF 2) Ambulate, Incentive spirometry 3) Transition to oral pain medication 4) Dulcolax suppository 5) D/C pelvic drain 6) Plan for likely discharge later today   Pryor Curia. MD   LOS: 1 day   Rickey Davidson,LES 09/02/2016, 7:17 AM

## 2016-09-02 NOTE — Discharge Summary (Signed)
  Date of admission: 09/01/2016  Date of discharge: 09/02/2016  Admission diagnosis: Prostate Cancer  Discharge diagnosis: Prostate Cancer  History and Physical: For full details, please see admission history and physical. Briefly, Rickey Davidson is a 60 y.o. gentleman with localized prostate cancer.  After discussing management/treatment options, he elected to proceed with surgical treatment.  Hospital Course: Rickey Davidson was taken to the operating room on 09/01/2016 and underwent a robotic assisted laparoscopic radical prostatectomy. He tolerated this procedure well and without complications. Postoperatively, he was able to be transferred to a regular hospital room following recovery from anesthesia.  He was able to begin ambulating the night of surgery. He remained hemodynamically stable overnight.  He had excellent urine output with appropriately minimal output from his pelvic drain and his pelvic drain was removed on POD #1.  He was transitioned to oral pain medication, tolerated a clear liquid diet, and had met all discharge criteria and was able to be discharged home later on POD#1.  Laboratory values:  Recent Labs  09/01/16 1358 09/02/16 0521  HGB 13.6 12.4*  HCT 39.7 36.4*    Disposition: Home  Discharge instruction: He was instructed to be ambulatory but to refrain from heavy lifting, strenuous activity, or driving. He was instructed on urethral catheter care.  Discharge medications:    Medication List    TAKE these medications   acyclovir 200 MG capsule Commonly known as:  ZOVIRAX Take 200 mg by mouth 2 (two) times daily as needed (for herpes flare ups).   HYDROcodone-acetaminophen 5-325 MG tablet Commonly known as:  NORCO Take 1-2 tablets by mouth every 6 (six) hours as needed for moderate pain or severe pain.   sulfamethoxazole-trimethoprim 800-160 MG tablet Commonly known as:  BACTRIM DS,SEPTRA DS Take 1 tablet by mouth 2 (two) times daily. Start the day prior  to foley removal appointment       Followup: He will followup in 1 week for catheter removal and to discuss his surgical pathology results.

## 2018-05-12 IMAGING — CR DG CHEST 2V
2 series · 2 of 2 positions shown · non-contrast
Comparison: None in PACs

CLINICAL DATA: Preoperative examination prior to prostatectomy. No
cardiopulmonary history. Nonsmoker.

EXAM:
CHEST  2 VIEW

[w chest pa]
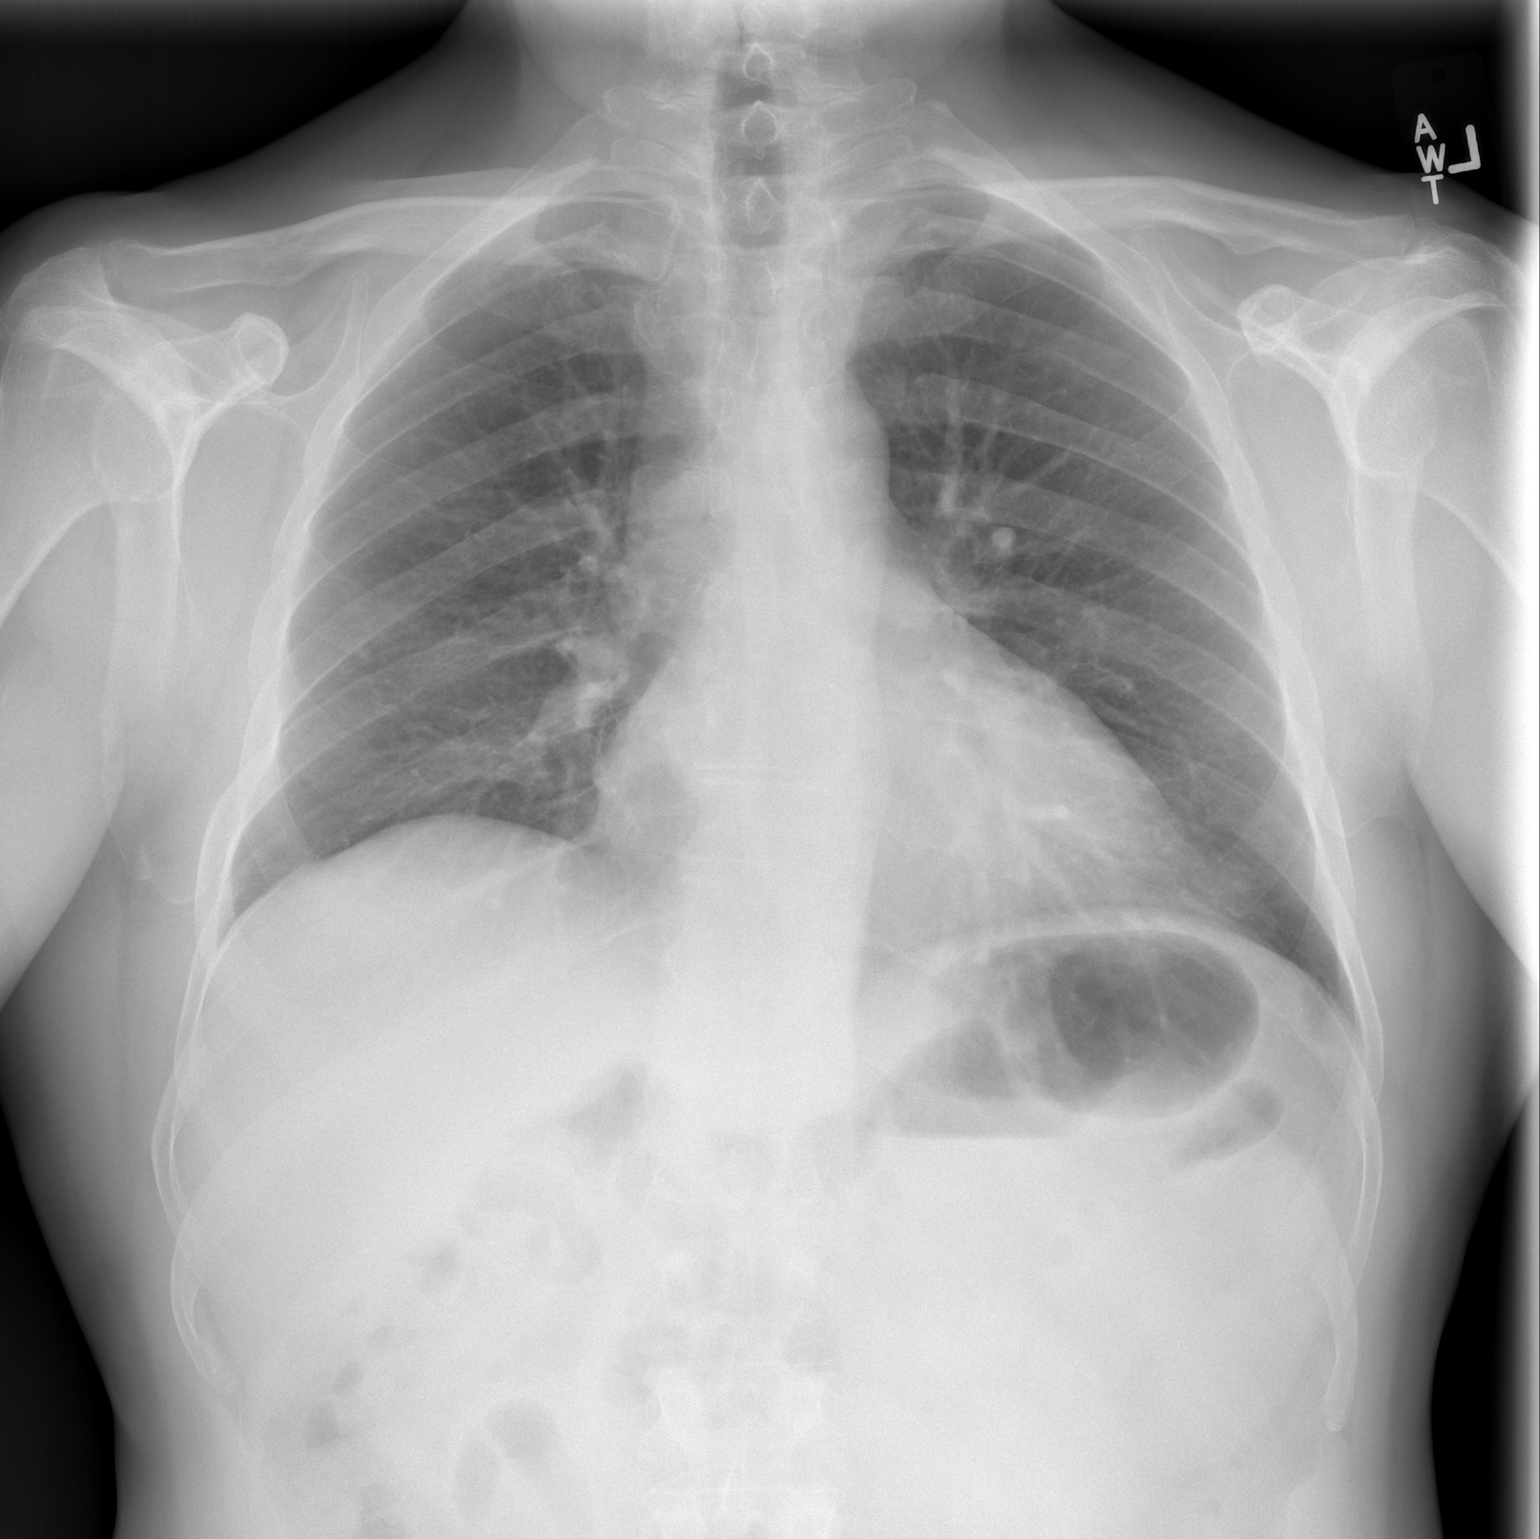

[w chest lat]
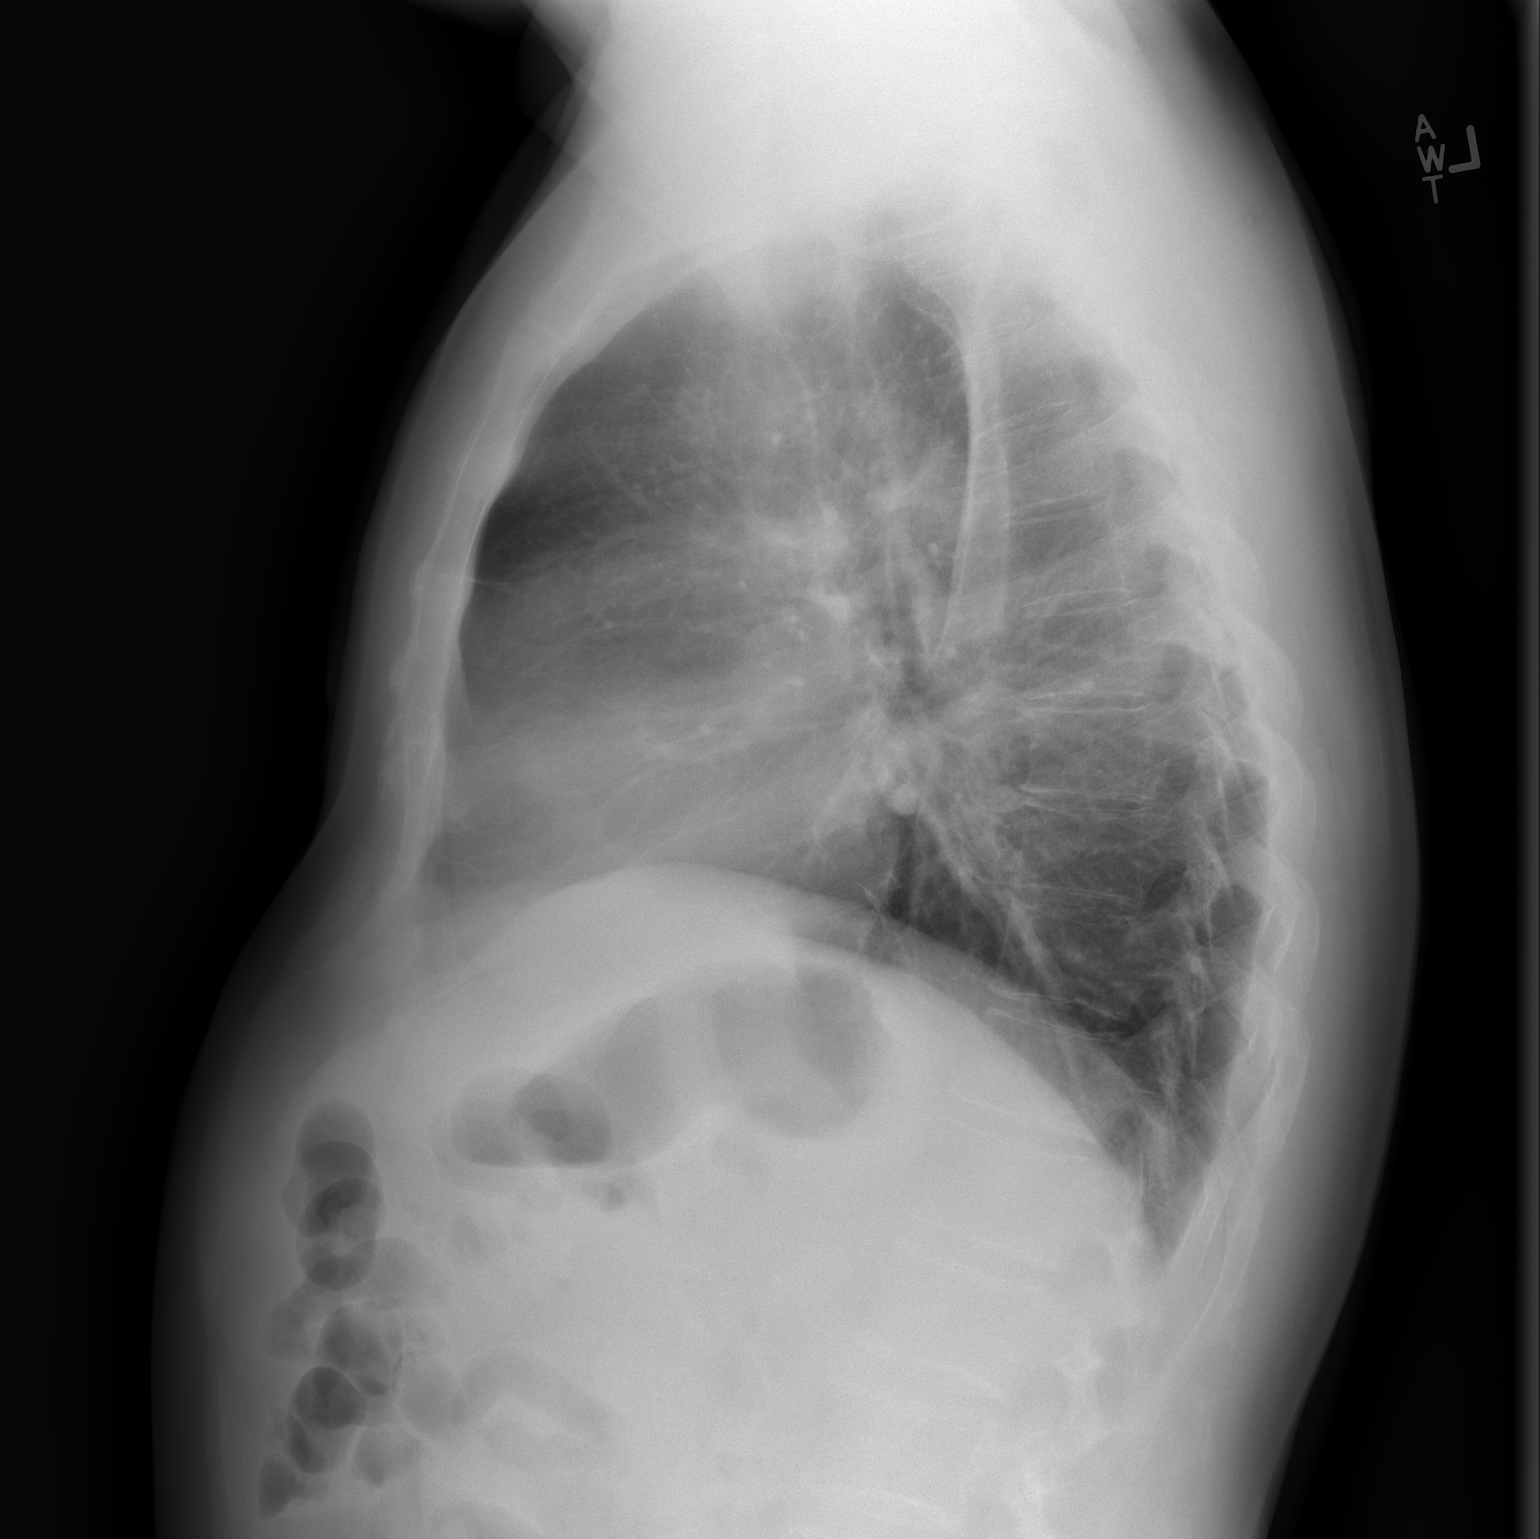

[2 of 2 positions shown; findings below may reference images not displayed]

FINDINGS: The lungs are adequately inflated and clear. The heart and pulmonary
vascularity are normal. The mediastinum is normal in width. There is
no pleural effusion. The bony thorax is unremarkable.
IMPRESSION: There is no active cardiopulmonary disease.

## 2024-01-03 DIAGNOSIS — I10 Essential (primary) hypertension: Secondary | ICD-10-CM | POA: Diagnosis not present

## 2024-01-03 DIAGNOSIS — J309 Allergic rhinitis, unspecified: Secondary | ICD-10-CM | POA: Diagnosis not present

## 2024-01-03 DIAGNOSIS — Z6830 Body mass index (BMI) 30.0-30.9, adult: Secondary | ICD-10-CM | POA: Diagnosis not present

## 2024-03-05 DIAGNOSIS — Z0000 Encounter for general adult medical examination without abnormal findings: Secondary | ICD-10-CM | POA: Diagnosis not present

## 2024-03-05 DIAGNOSIS — I10 Essential (primary) hypertension: Secondary | ICD-10-CM | POA: Diagnosis not present

## 2024-03-05 DIAGNOSIS — J309 Allergic rhinitis, unspecified: Secondary | ICD-10-CM | POA: Diagnosis not present

## 2024-03-05 DIAGNOSIS — Z6829 Body mass index (BMI) 29.0-29.9, adult: Secondary | ICD-10-CM | POA: Diagnosis not present
